# Patient Record
Sex: Female | Born: 1935 | Race: White | Hispanic: No
Health system: Southern US, Community
[De-identification: ages and names within clinical notes are randomized; demographics above are authoritative.]

---

## 2014-08-09 ENCOUNTER — Inpatient Hospital Stay: Payer: Self-pay | Admitting: Specialist

## 2014-08-09 LAB — CBC
HCT: 40.8 % (ref 35.0–47.0)
HGB: 13.5 g/dL (ref 12.0–16.0)
MCH: 29.2 pg (ref 26.0–34.0)
MCHC: 33.2 g/dL (ref 32.0–36.0)
MCV: 88 fL (ref 80–100)
Platelet: 211 10*3/uL (ref 150–440)
RBC: 4.63 10*6/uL (ref 3.80–5.20)
RDW: 13.8 % (ref 11.5–14.5)
WBC: 10.5 10*3/uL (ref 3.6–11.0)

## 2014-08-09 LAB — URINALYSIS, COMPLETE
Bilirubin,UR: NEGATIVE
Glucose,UR: NEGATIVE mg/dL (ref 0–75)
KETONE: NEGATIVE
Leukocyte Esterase: NEGATIVE
Nitrite: NEGATIVE
PH: 5 (ref 4.5–8.0)
Protein: 30
SPECIFIC GRAVITY: 1.021 (ref 1.003–1.030)

## 2014-08-09 LAB — COMPREHENSIVE METABOLIC PANEL
ALT: 171 U/L — AB
Albumin: 3.3 g/dL — ABNORMAL LOW (ref 3.4–5.0)
Alkaline Phosphatase: 112 U/L
Anion Gap: 12 (ref 7–16)
BILIRUBIN TOTAL: 0.5 mg/dL (ref 0.2–1.0)
BUN: 19 mg/dL — ABNORMAL HIGH (ref 7–18)
CALCIUM: 8.5 mg/dL (ref 8.5–10.1)
CO2: 24 mmol/L (ref 21–32)
Chloride: 106 mmol/L (ref 98–107)
Creatinine: 1.21 mg/dL (ref 0.60–1.30)
GFR CALC AF AMER: 49 — AB
GFR CALC NON AF AMER: 43 — AB
GLUCOSE: 138 mg/dL — AB (ref 65–99)
OSMOLALITY: 288 (ref 275–301)
POTASSIUM: 3.5 mmol/L (ref 3.5–5.1)
SGOT(AST): 127 U/L — ABNORMAL HIGH (ref 15–37)
SODIUM: 142 mmol/L (ref 136–145)
Total Protein: 7.4 g/dL (ref 6.4–8.2)

## 2014-08-09 LAB — APTT: Activated PTT: 29.8 secs (ref 23.6–35.9)

## 2014-08-09 LAB — PROTIME-INR
INR: 1.1
PROTHROMBIN TIME: 13.8 s (ref 11.5–14.7)

## 2014-08-09 LAB — CK TOTAL AND CKMB (NOT AT ARMC)
CK, TOTAL: 147 U/L
CK-MB: 1.5 ng/mL (ref 0.5–3.6)

## 2014-08-10 LAB — BASIC METABOLIC PANEL
Anion Gap: 8 (ref 7–16)
BUN: 18 mg/dL (ref 7–18)
CALCIUM: 7.6 mg/dL — AB (ref 8.5–10.1)
Chloride: 108 mmol/L — ABNORMAL HIGH (ref 98–107)
Co2: 26 mmol/L (ref 21–32)
Creatinine: 1.35 mg/dL — ABNORMAL HIGH (ref 0.60–1.30)
EGFR (Non-African Amer.): 37 — ABNORMAL LOW
GFR CALC AF AMER: 43 — AB
Glucose: 133 mg/dL — ABNORMAL HIGH (ref 65–99)
Osmolality: 287 (ref 275–301)
POTASSIUM: 3.3 mmol/L — AB (ref 3.5–5.1)
Sodium: 142 mmol/L (ref 136–145)

## 2014-08-10 LAB — HEPATIC FUNCTION PANEL A (ARMC)
ALK PHOS: 109 U/L
ALT: 270 U/L — AB
AST: 369 U/L — AB (ref 15–37)
Albumin: 2.5 g/dL — ABNORMAL LOW (ref 3.4–5.0)
BILIRUBIN TOTAL: 1.2 mg/dL — AB (ref 0.2–1.0)
Bilirubin, Direct: 0.8 mg/dL — ABNORMAL HIGH (ref 0.00–0.20)
Total Protein: 5.9 g/dL — ABNORMAL LOW (ref 6.4–8.2)

## 2014-08-10 LAB — PLATELET COUNT: Platelet: 148 10*3/uL — ABNORMAL LOW (ref 150–440)

## 2014-08-10 LAB — HEMOGLOBIN: HGB: 11.1 g/dL — ABNORMAL LOW (ref 12.0–16.0)

## 2014-08-11 LAB — COMPREHENSIVE METABOLIC PANEL
ANION GAP: 9 (ref 7–16)
Albumin: 2.5 g/dL — ABNORMAL LOW (ref 3.4–5.0)
Alkaline Phosphatase: 118 U/L — ABNORMAL HIGH
BILIRUBIN TOTAL: 0.6 mg/dL (ref 0.2–1.0)
BUN: 18 mg/dL (ref 7–18)
CREATININE: 1.26 mg/dL (ref 0.60–1.30)
Calcium, Total: 7.7 mg/dL — ABNORMAL LOW (ref 8.5–10.1)
Chloride: 110 mmol/L — ABNORMAL HIGH (ref 98–107)
Co2: 24 mmol/L (ref 21–32)
EGFR (African American): 47 — ABNORMAL LOW
EGFR (Non-African Amer.): 40 — ABNORMAL LOW
Glucose: 106 mg/dL — ABNORMAL HIGH (ref 65–99)
Osmolality: 287 (ref 275–301)
Potassium: 3.4 mmol/L — ABNORMAL LOW (ref 3.5–5.1)
SGOT(AST): 146 U/L — ABNORMAL HIGH (ref 15–37)
SGPT (ALT): 118 U/L — ABNORMAL HIGH
Sodium: 143 mmol/L (ref 136–145)
Total Protein: 5.9 g/dL — ABNORMAL LOW (ref 6.4–8.2)

## 2014-08-11 LAB — HEMOGLOBIN: HGB: 11.4 g/dL — ABNORMAL LOW (ref 12.0–16.0)

## 2014-08-11 LAB — PLATELET COUNT: PLATELETS: 153 10*3/uL (ref 150–440)

## 2014-08-11 LAB — HEPATIC FUNCTION PANEL A (ARMC): Bilirubin, Direct: 0.2 mg/dL (ref 0.00–0.20)

## 2014-08-11 LAB — GAMMA GT: GGT: 196 U/L — AB (ref 5–85)

## 2014-08-12 LAB — BASIC METABOLIC PANEL
Anion Gap: 6 — ABNORMAL LOW (ref 7–16)
BUN: 22 mg/dL — ABNORMAL HIGH (ref 7–18)
CHLORIDE: 112 mmol/L — AB (ref 98–107)
CO2: 23 mmol/L (ref 21–32)
Calcium, Total: 7.9 mg/dL — ABNORMAL LOW (ref 8.5–10.1)
Creatinine: 1.37 mg/dL — ABNORMAL HIGH (ref 0.60–1.30)
EGFR (African American): 42 — ABNORMAL LOW
GFR CALC NON AF AMER: 37 — AB
GLUCOSE: 110 mg/dL — AB (ref 65–99)
OSMOLALITY: 285 (ref 275–301)
Potassium: 3.9 mmol/L (ref 3.5–5.1)
Sodium: 141 mmol/L (ref 136–145)

## 2014-08-12 LAB — HEPATIC FUNCTION PANEL A (ARMC)
Albumin: 2.5 g/dL — ABNORMAL LOW (ref 3.4–5.0)
Alkaline Phosphatase: 107 U/L
Bilirubin, Direct: 0.1 mg/dL (ref 0.00–0.20)
Bilirubin,Total: 0.4 mg/dL (ref 0.2–1.0)
SGOT(AST): 58 U/L — ABNORMAL HIGH (ref 15–37)
SGPT (ALT): 53 U/L
TOTAL PROTEIN: 5.5 g/dL — AB (ref 6.4–8.2)

## 2014-08-13 LAB — HEPATIC FUNCTION PANEL A (ARMC)
ALT: 40 U/L
Albumin: 2.4 g/dL — ABNORMAL LOW (ref 3.4–5.0)
Alkaline Phosphatase: 100 U/L
Bilirubin, Direct: 0.1 mg/dL (ref 0.00–0.20)
Bilirubin,Total: 0.5 mg/dL (ref 0.2–1.0)
SGOT(AST): 40 U/L — ABNORMAL HIGH (ref 15–37)
Total Protein: 6.4 g/dL (ref 6.4–8.2)

## 2014-08-13 LAB — BASIC METABOLIC PANEL
ANION GAP: 9 (ref 7–16)
BUN: 22 mg/dL — AB (ref 7–18)
CHLORIDE: 111 mmol/L — AB (ref 98–107)
CO2: 23 mmol/L (ref 21–32)
CREATININE: 1.24 mg/dL (ref 0.60–1.30)
Calcium, Total: 7.9 mg/dL — ABNORMAL LOW (ref 8.5–10.1)
EGFR (Non-African Amer.): 41 — ABNORMAL LOW
GFR CALC AF AMER: 48 — AB
GLUCOSE: 143 mg/dL — AB (ref 65–99)
Osmolality: 291 (ref 275–301)
POTASSIUM: 3.6 mmol/L (ref 3.5–5.1)
Sodium: 143 mmol/L (ref 136–145)

## 2015-01-09 IMAGING — CT CT OF THE RIGHT HIP WITHOUT CONTRAST
1 series · 15 of 32 positions shown, 19 images · non-contrast
Comparison: Right hip radiographs performed earlier today at [DATE]
a.m.

CLINICAL DATA: Follow-up right femoral fracture; preoperative
planning.

EXAM:
CT OF THE RIGHT HIP WITHOUT CONTRAST
TECHNIQUE: Multidetector CT imaging was performed according to the standard
protocol. Multiplanar CT image reconstructions were also generated.

[Series 5: soft tissue · axial · 0.34mm/px · z∈[-1133,-983]mm · 15 of 112 slices shown, 19 images]
[im 8/112  soft-tissue]
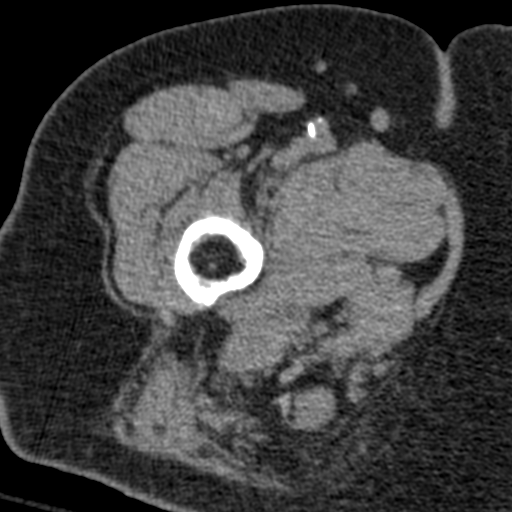
[im 8/112  bone]
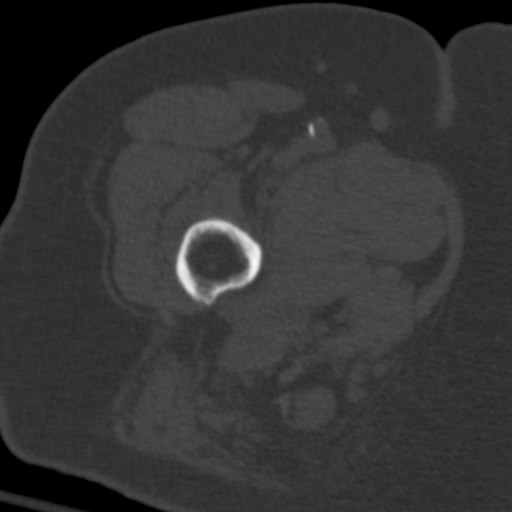
[im 15/112  soft-tissue]
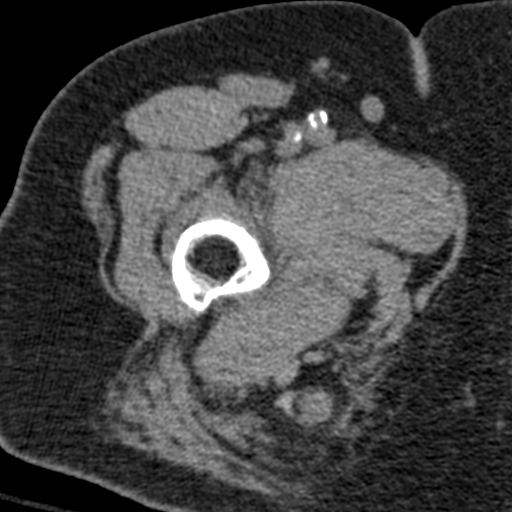
[im 22/112  soft-tissue]
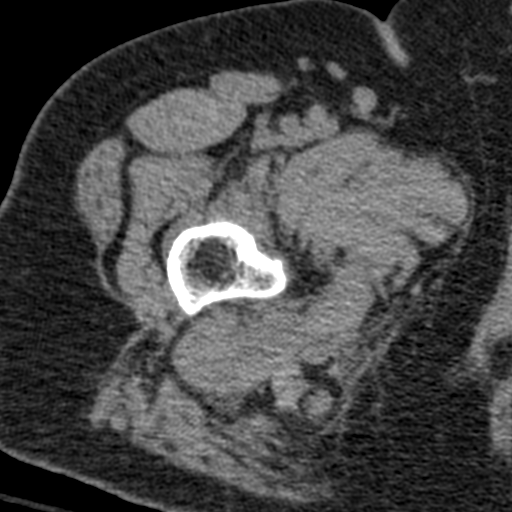
[im 33/112  soft-tissue]
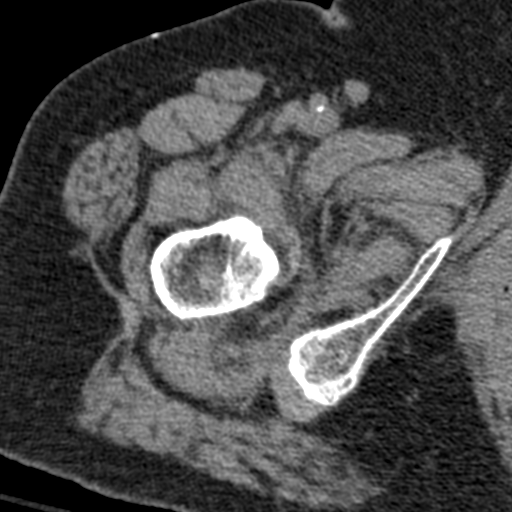
[im 40/112  soft-tissue]
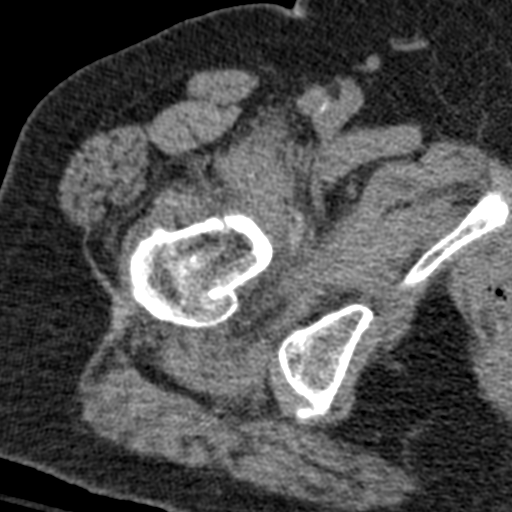
[im 47/112  soft-tissue]
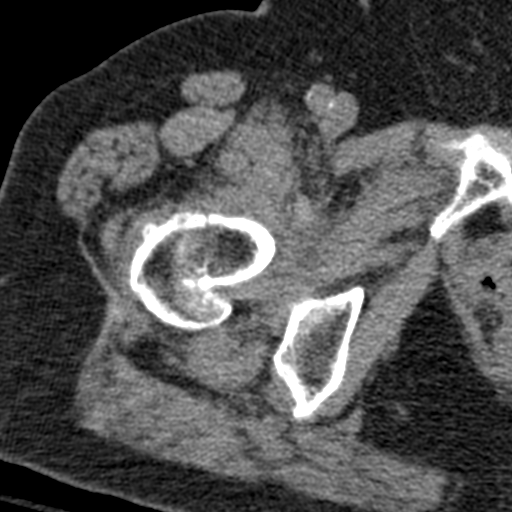
[im 58/112  soft-tissue]
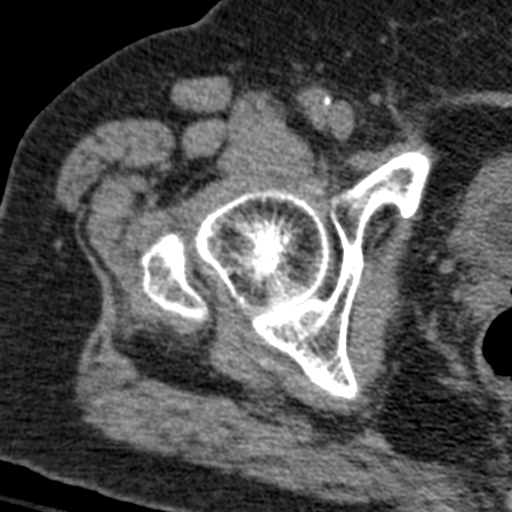
[im 65/112  soft-tissue]
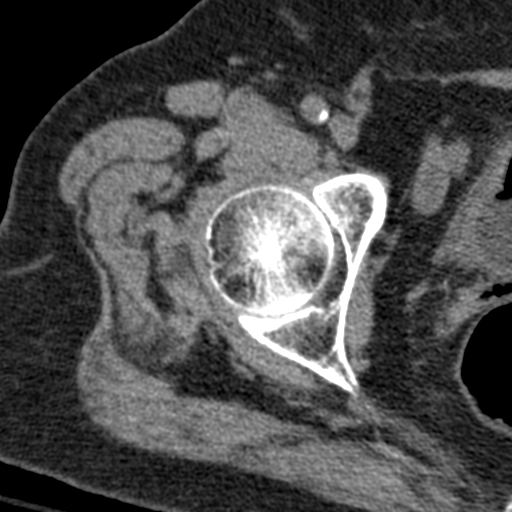
[im 72/112  soft-tissue]
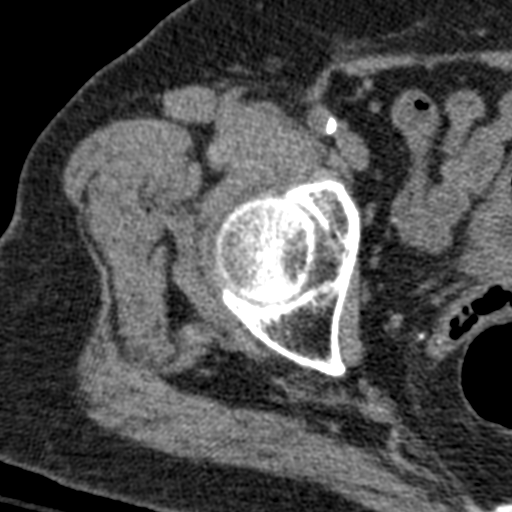
[im 72/112  bone]
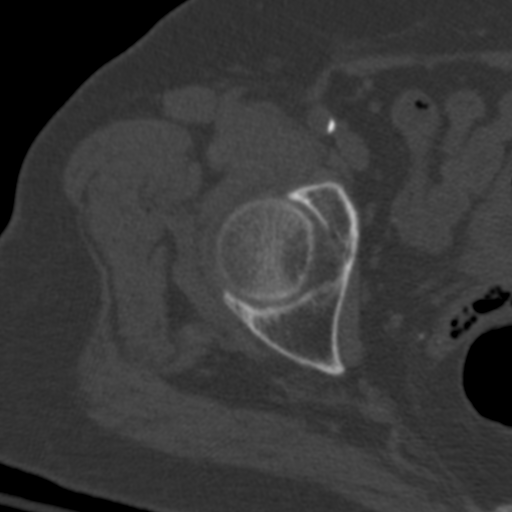
[im 79/112  soft-tissue]
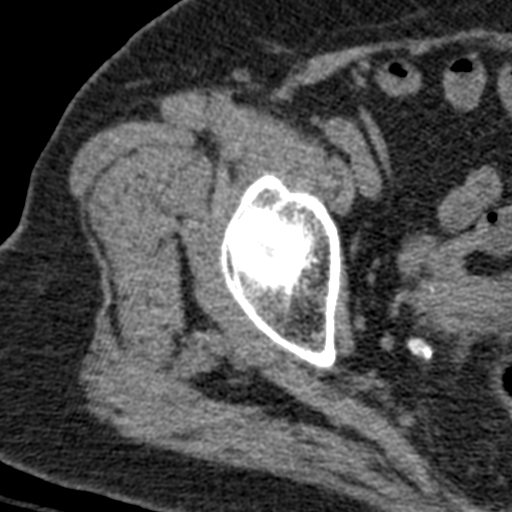
[im 90/112  soft-tissue]
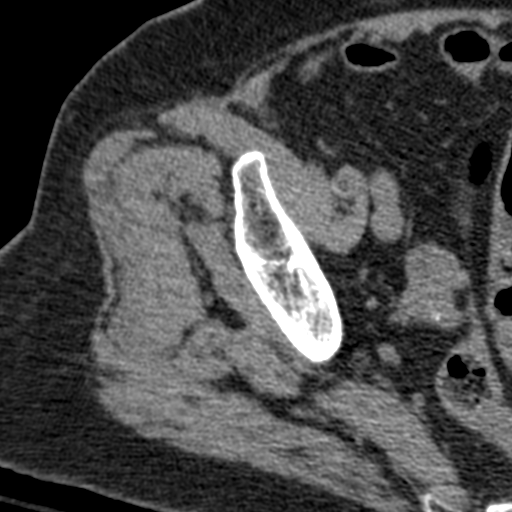
[im 97/112  soft-tissue]
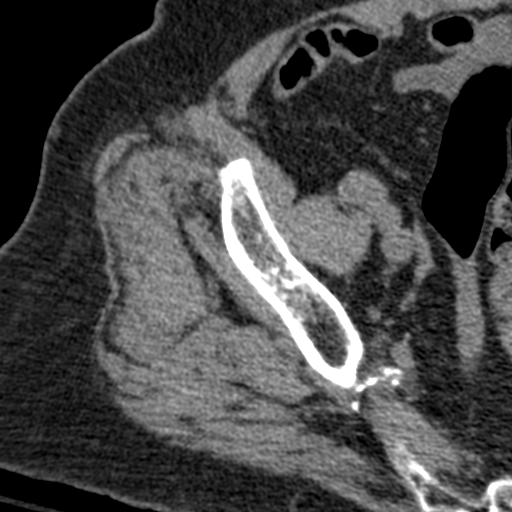
[im 97/112  lung]
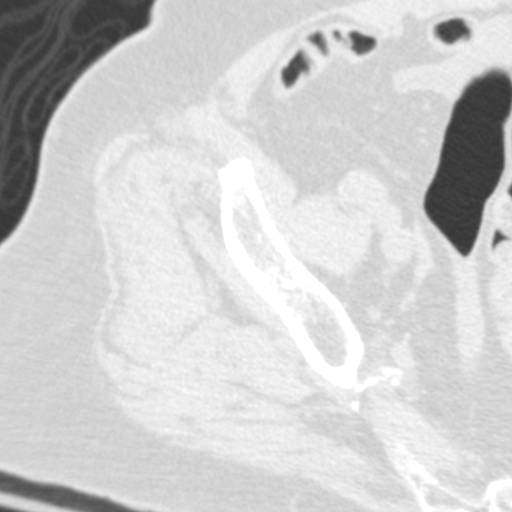
[im 101/112  lung]
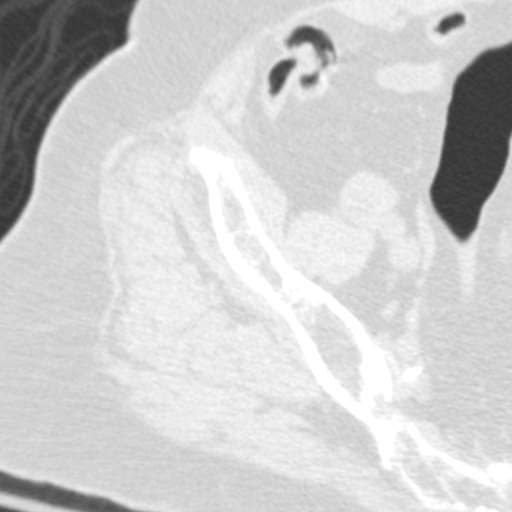
[im 104/112  soft-tissue]
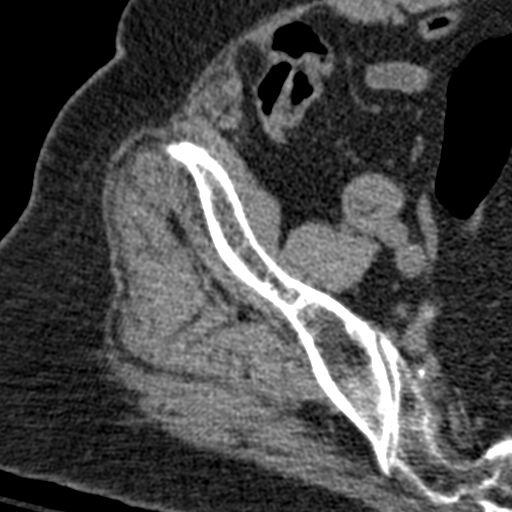
[im 104/112  lung]
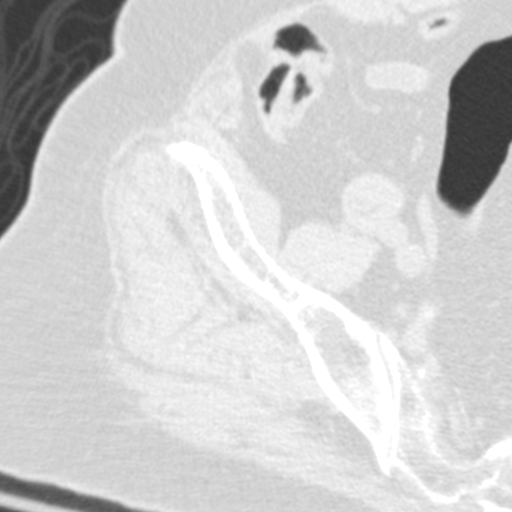
[im 108/112  lung]
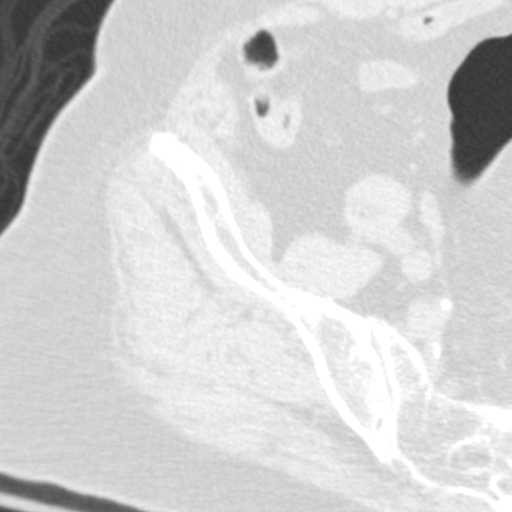

[15 of 32 positions shown; findings below may reference images not displayed]

FINDINGS: There is a minimally displaced basicervical fracture through the
right femoral neck, with slight involvement of the medial aspect of
the greater femoral trochanter. The right hip joint is grossly
unremarkable in appearance. The right femoral head remains seated at
the acetabulum.

No significant hip joint effusion is seen. Mild associated soft
tissue injury is suggested.

Scattered vascular calcifications are seen. Visualized small and
large bowel loops are grossly unremarkable. The bladder appears
somewhat thick-walled and irregular, with air noted in the bladder,
possibly reflecting Foley catheter placement.
IMPRESSION: 1. Minimally displaced basicervical fracture through the right
femoral neck, with slight involvement of the medial aspect of the
greater femoral trochanter. Mild associated soft tissue injury
noted.
2. Scattered vascular calcifications seen.
3. Thick-walled and irregular-appearing bladder, with air seen in
the bladder, possibly reflecting Foley catheter placement. Would
correlate for associated symptoms, and consider further evaluation
as deemed clinically appropriate.

## 2015-01-09 IMAGING — CR RIGHT HIP - 1 VIEW
4 series · 4 of 4 positions shown · non-contrast
Comparison: 08/09/2014.

CLINICAL DATA: Status post ORIF for hip fracture.

EXAM:
RIGHT HIP - 1 VIEW

[cont. (1 of 4)]
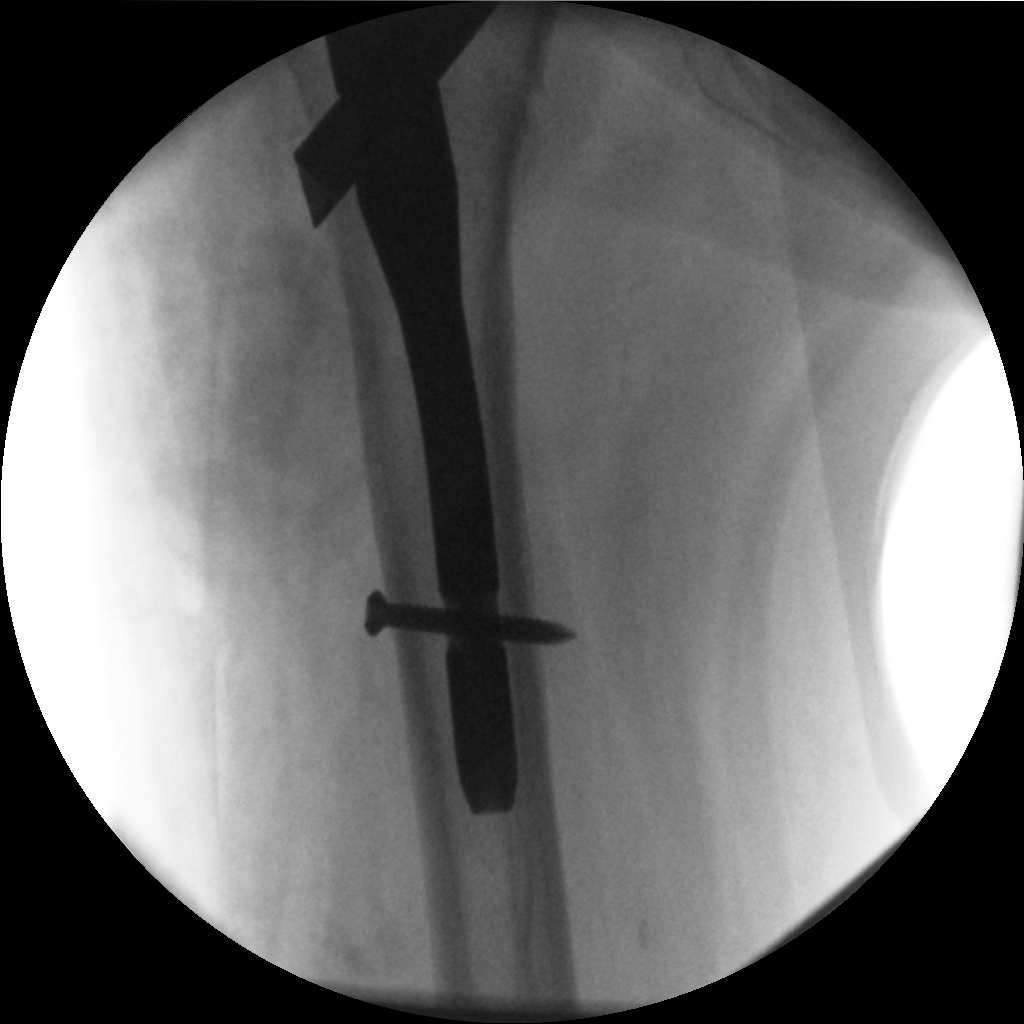

[cont. (2 of 4)]
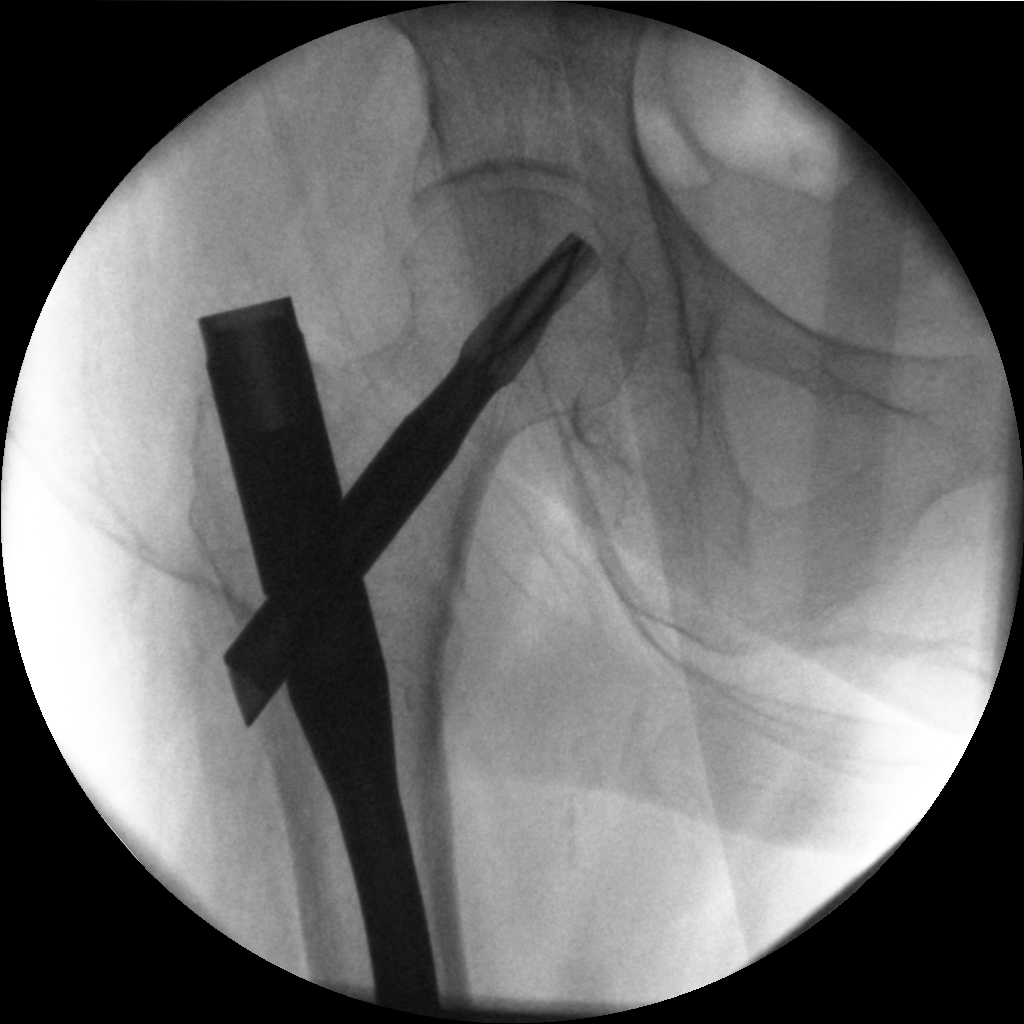

[cont. (3 of 4)]
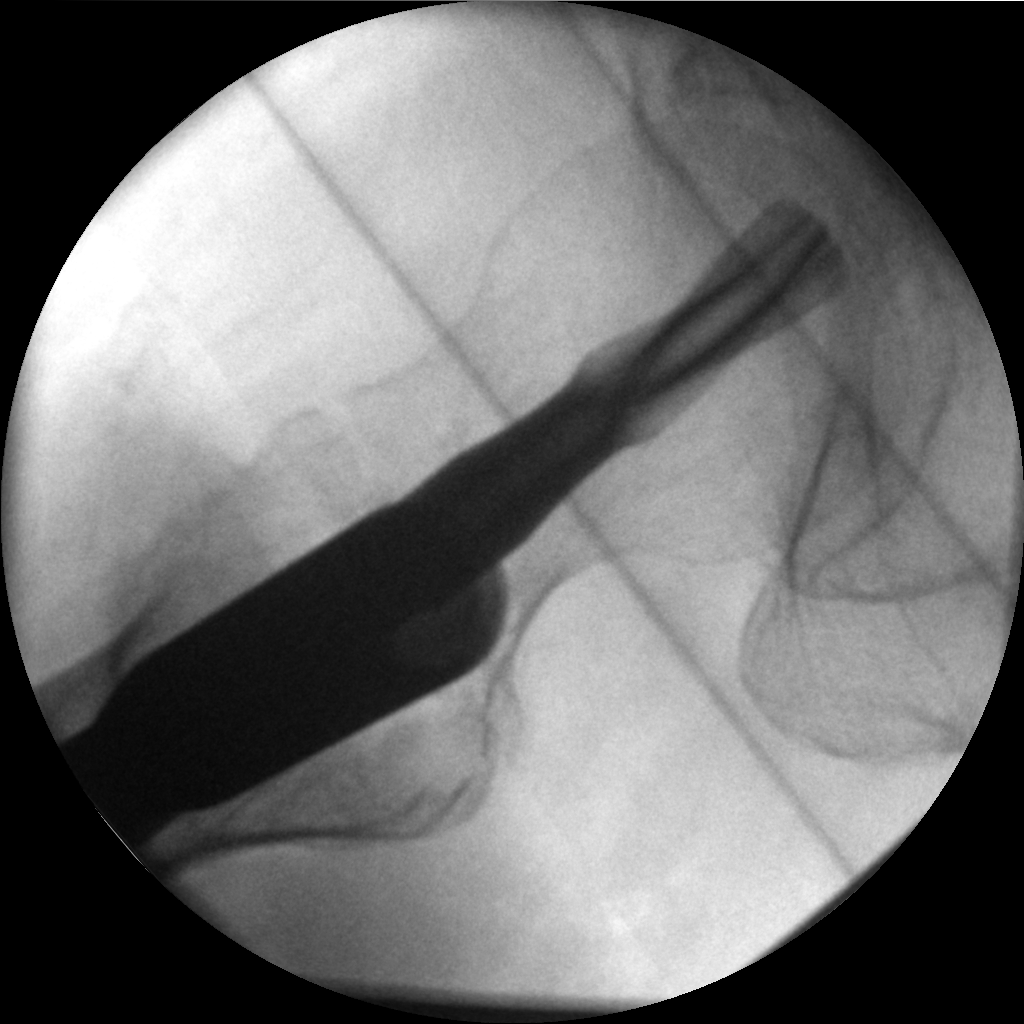

[cont. (4 of 4)]
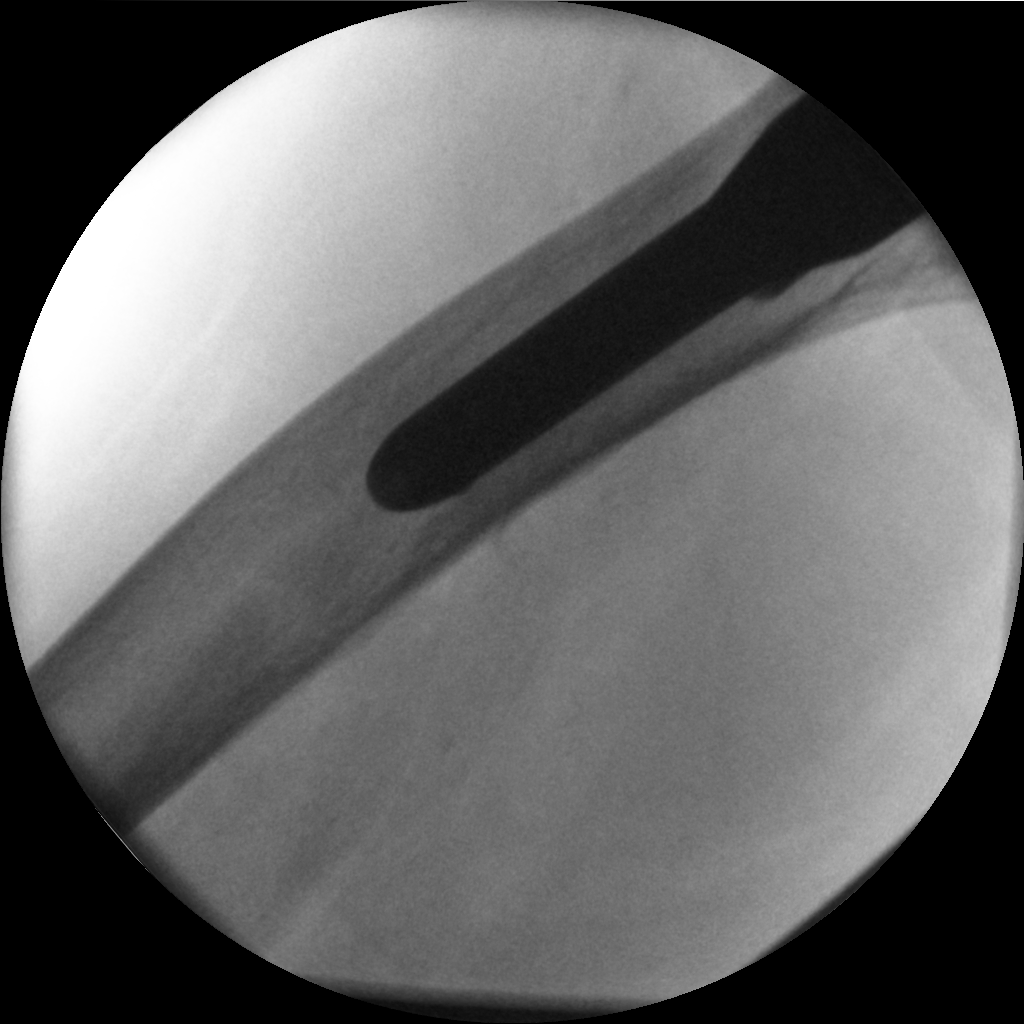

[4 of 4 positions shown; findings below may reference images not displayed]

FINDINGS: Multiple intraoperative fluoroscopic spot views of the right hip
demonstrate interval gamma nail fixation for previously noted
basicervical fracture. Anatomic alignment has been preserved. The
femoral head is located. No acute complicating features.
IMPRESSION: 1. Intraoperative documentation of ORIF for right basicervical
femoral neck fracture.

## 2015-01-10 IMAGING — US ABDOMEN ULTRASOUND LIMITED
1 series · 14 of 25 positions shown · non-contrast
Comparison: None.

CLINICAL DATA: Elevated liver enzymes

EXAM:
US ABDOMEN LIMITED - RIGHT UPPER QUADRANT

[Series 1: abdomen ultrasound limited · 0.25mm/px · 14 of 34 slices shown]
[im 1/34]
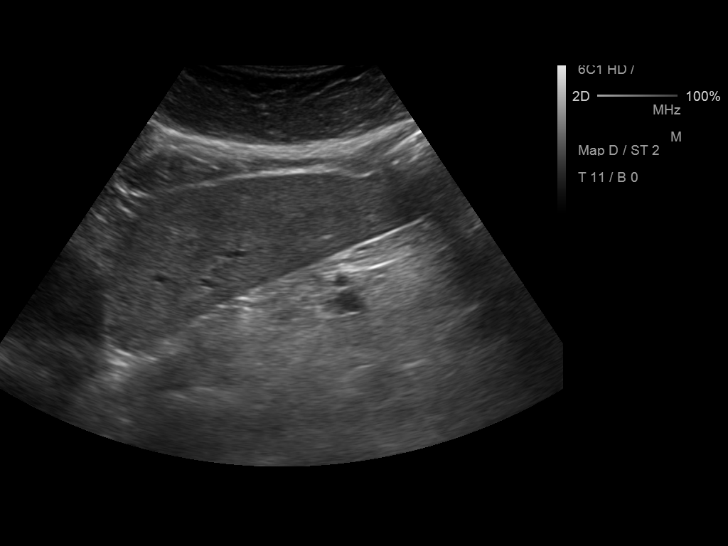
[im 3/34]
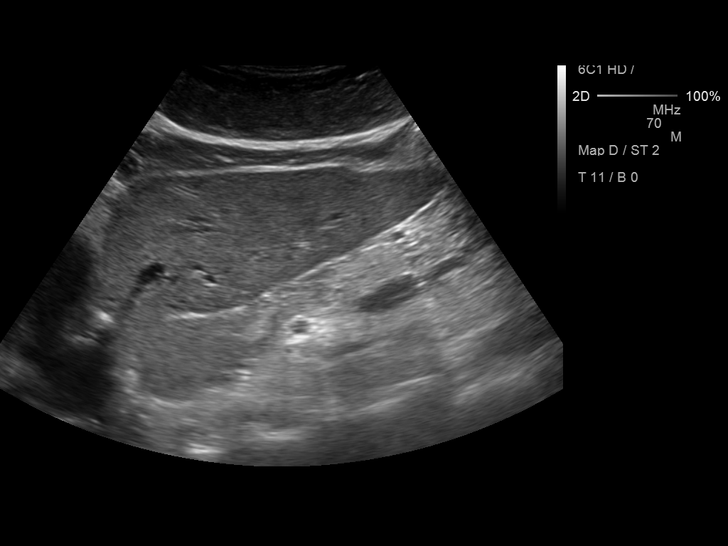
[im 6/34]
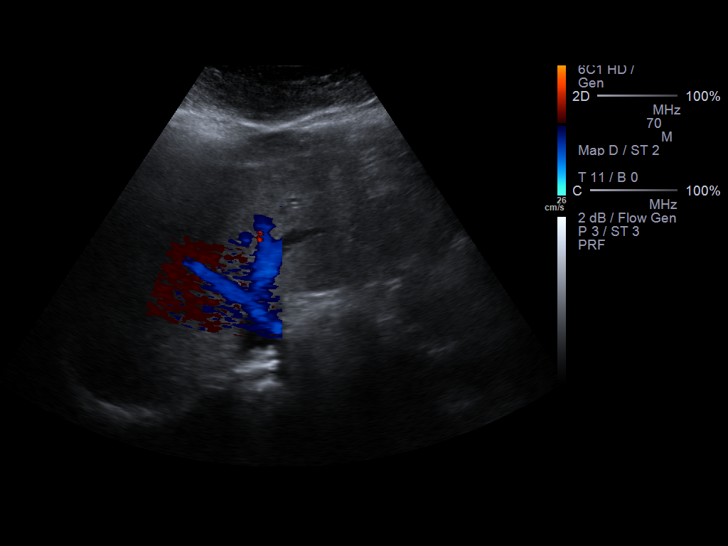
[im 9/34]
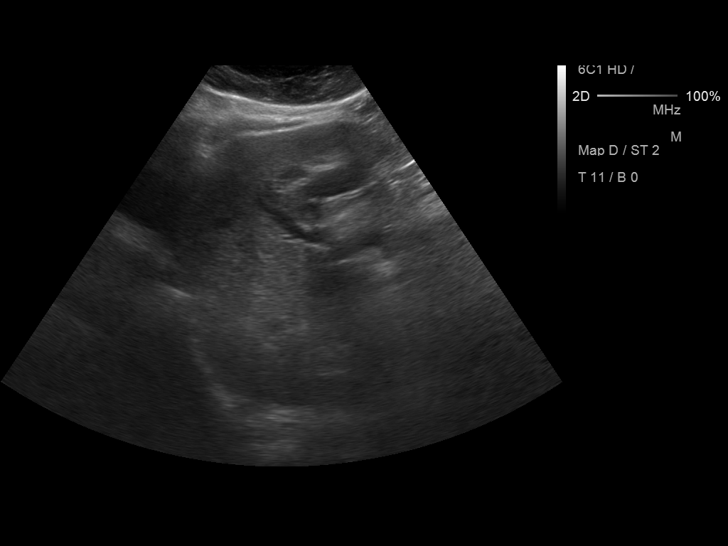
[im 12/34]
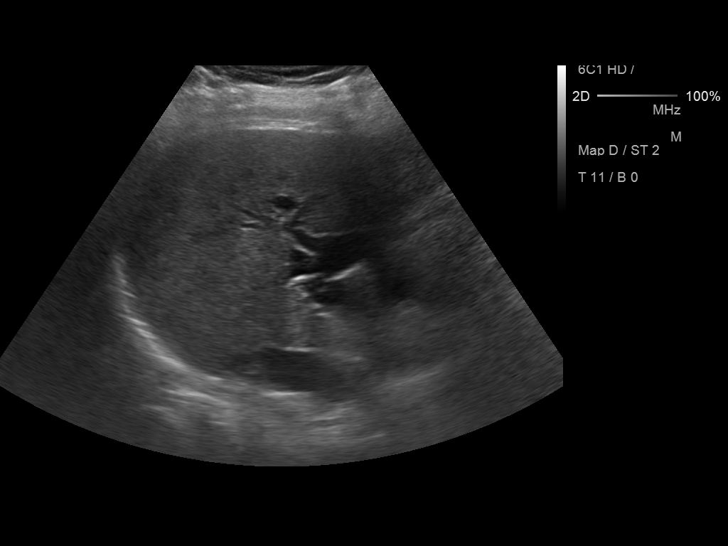
[im 13/34]
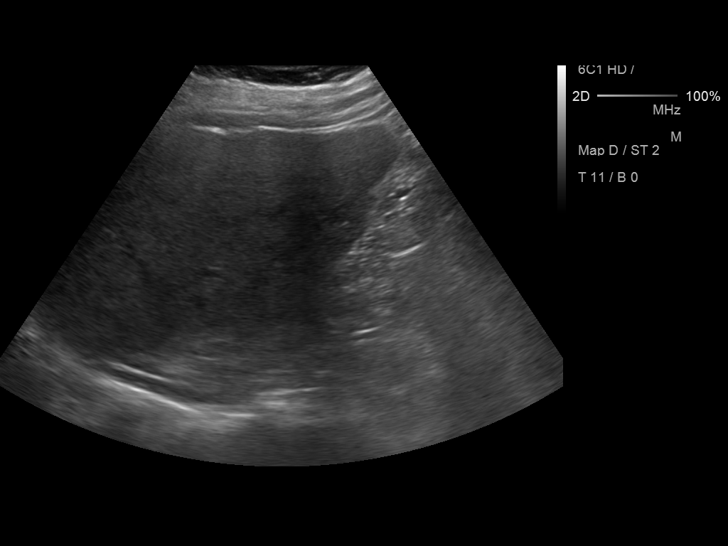
[im 16/34]
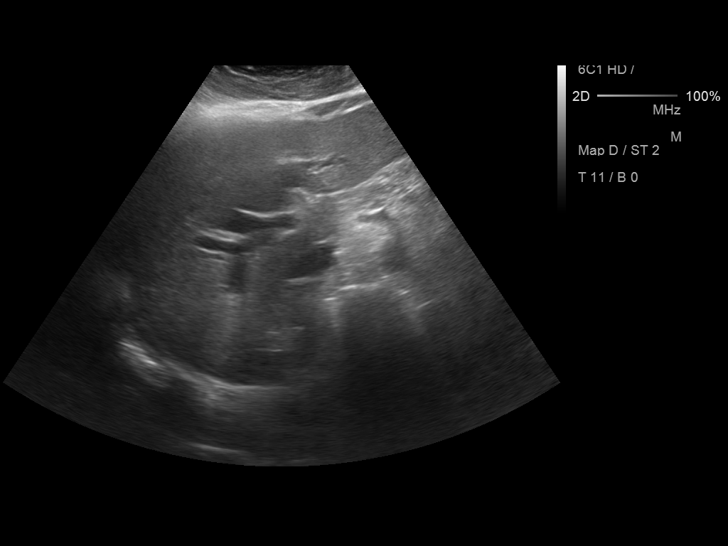
[im 18/34]
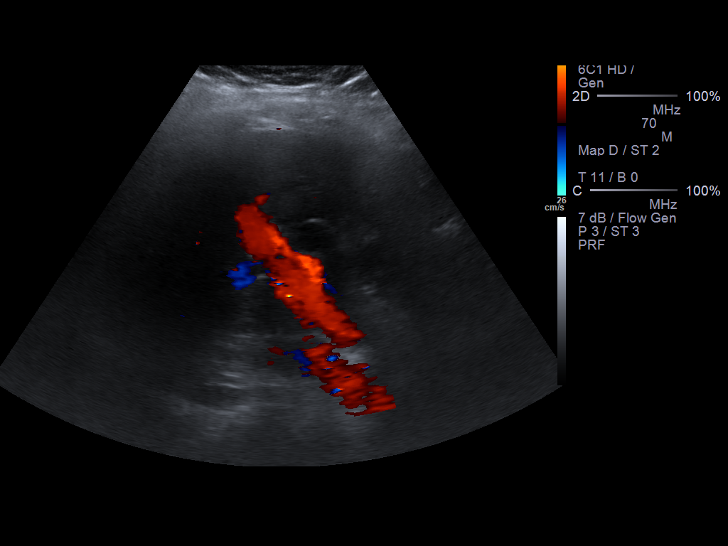
[im 21/34]
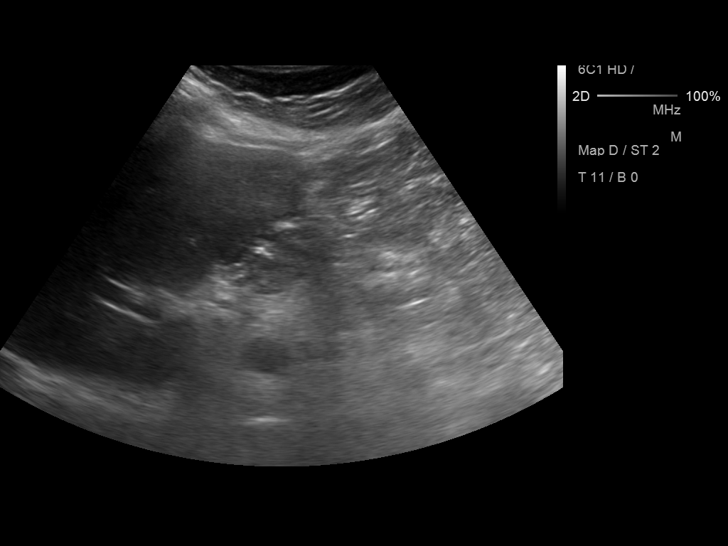
[im 23/34]
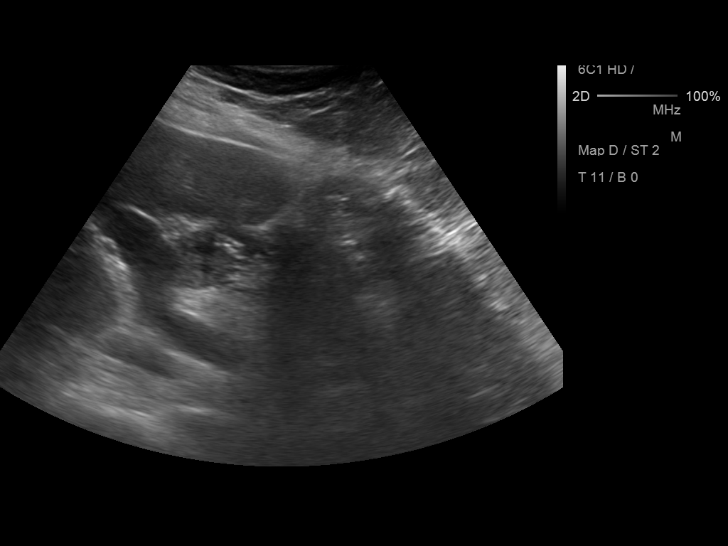
[im 25/34]
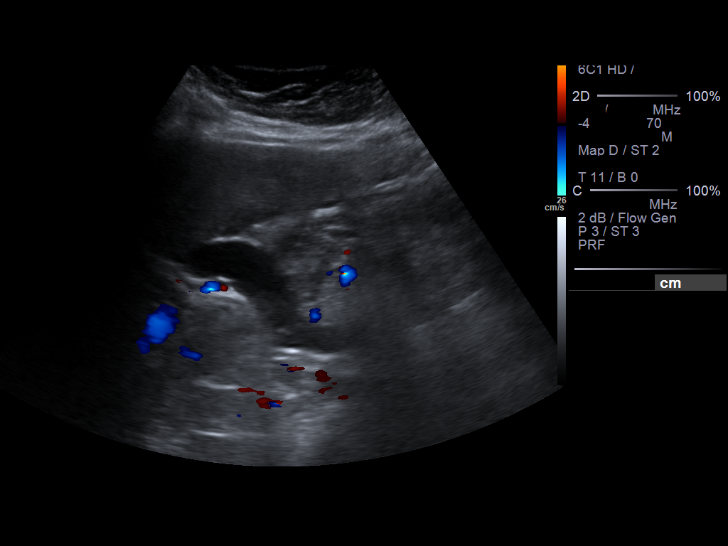
[im 28/34]
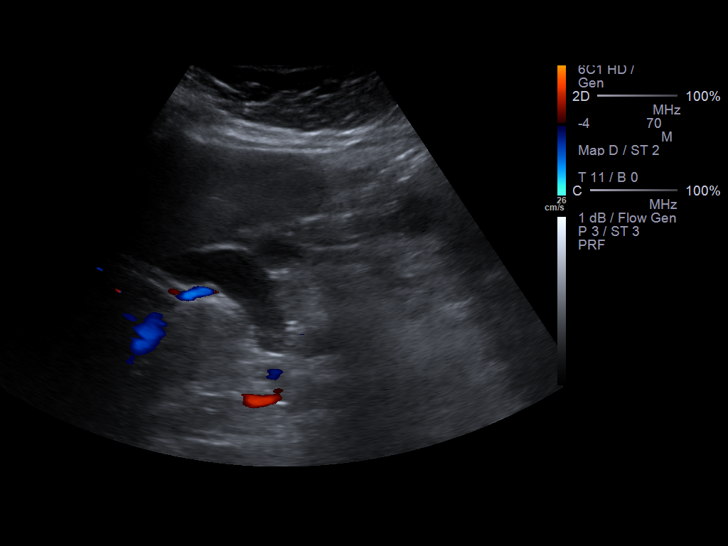
[im 31/34]
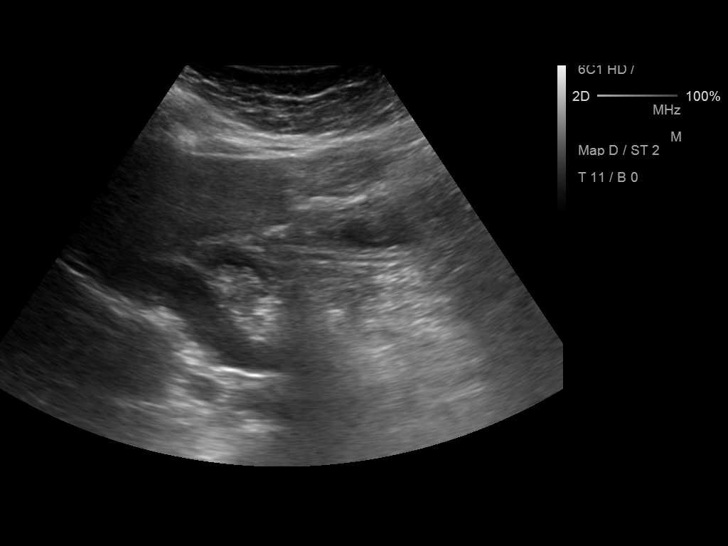
[im 34/34]
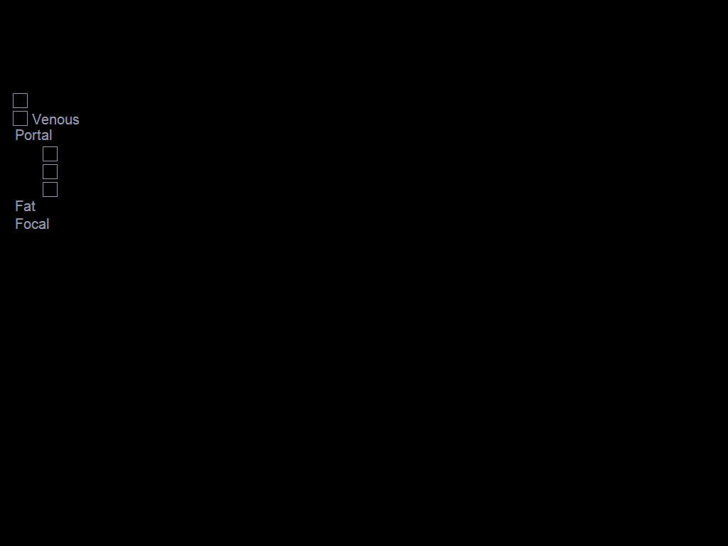

[14 of 25 positions shown; findings below may reference images not displayed]

FINDINGS: Gallbladder:

Surgically absent.

Common bile duct:

Diameter: The common hepatic duct measures 15 mm. The common bile
duct more distally tapers to 9 mm. There is also mild intrahepatic
biliary duct dilatation.

Liver:

No focal lesion identified.  Liver echogenicity is mildly increased.
IMPRESSION: There is biliary duct dilatation which may in part be due to post
cholecystectomy state. No focal lesion is identified within the
biliary ductal system. If there remains concern for biliary duct
pathology beyond dilatation, however, MRCP would probably be the
imaging study of choice to further evaluate.

Gallbladder is absent.

Increased echogenicity in the liver is consistent with hepatic
steatosis. While no focal liver lesions are identified, it should be
noted that the sensitivity of ultrasound for focal liver lesions is
diminished in this circumstance.

## 2015-04-20 NOTE — Consult Note (Signed)
LFT's down to normal.  Possible passed stone, possible epiphenomenon from surgery/anesthesia.  i will sign off, reconsult if needed.  Electronic Signatures: Scot JunElliott, Terrance Usery T (MD)  (Signed on 16-Aug-15 08:34)  Authored  Last Updated: 16-Aug-15 08:34 by Scot JunElliott, Yovany Clock T (MD)

## 2015-04-20 NOTE — Consult Note (Signed)
PATIENT NAME:  Reece LeaderANCE, Allyce G MR#:  [id number] DATE OF BIRTH:  1933-03-04  DATE OF CONSULTATION:  08/10/2014  REFERRING PHYSICIAN:    CONSULTING PHYSICIAN:  Cala BradfordKimberly A. Arvilla MarketMills, ANP (Adult Nurse Practitioner)  REFERRING PHYSICIAN:  Dr. Maryruth HancockSallie Patel, MD, MPH    CONSULTING PHYSICIAN: Scot Junobert T Elliott, MD/Tamaira Ciriello Arvilla MarketMills, ANP (adult nurse practitioner)   PRIMARY CARE PHYSICIAN:  Dr. Elna BreslowKathleen M Shapley-Quinn, MD      REASON FOR CONSULTATION: Elevated liver enzymes.   HISTORY OF PRESENT ILLNESS: This 81 year old patient has a history of dementia, and had a fall in her kitchen. She sustained a nondisplaced intratrochanteric fracture of the right hip, and was admitted to the hospital yesterday. She underwent surgery yesterday for this hip fracture. She has been found to have rising AST of 127 to 369, and ALT 171 to 270. In addition, she presented with total bilirubin 0.5, which is now 1.2, with direct bilirubin 0.80. The patient did have an abdominal ultrasound that showed fatty liver. There was biliary ductal dilatation maybe part due to past cholecystectomy. The patient has dementia, and is unable to give a good history, but she does have episodes of vomiting twice today when physical therapy sat her up. She has been tolerating clear liquids well all day long without nausea, or vomiting while drinking those liquids. She has denied abdominal pain. Chart reviewed, we do not have baseline liver labs to compare.   PAST MEDICAL HISTORY: 1.  Alzheimer dementia.  2.  CVA without residual deficits.  3.  Hypertension.  4.  Abdominal aortic aneurysm not repaired.   HOME MEDICATIONS:  Include:  1.  Lisinopril 20 mg daily.  2.  Benazepril 10 mg daily.  3.  Demadex 5 mg daily.  4.  Coreg 12.5 mg daily.  5.  Aspirin 81 mg daily.   ALLERGIES: NKDA  per chart review.   FAMILY HISTORY:  Unknown cardiovascular, pulmonary disorders.   SOCIAL HISTORY:  Negative alcohol or tobacco listed.   REVIEW OF  SYSTEMS: The patient is unable to give accurate review of systems. No family member is present at the bedside to assist. The patient states she is feeling pretty good.   PHYSICAL EXAMINATION: VITAL SIGNS: Temperature 97.8, pulse 63, respirations 17, blood pressure 116/67, pulse oximetry room air is 96%.   GENERAL:  Elderly Caucasian female who is sitting up in bed, somewhat drowsy.  HEENT:  Shows head is normocephalic, conjunctivae pink, sclerae anicteric, oral mucosa is dry and intact.  NECK:  Supple. Trachea is midline. No thyromegaly.  CARDIAC:  S1 and S2 without murmur.  LUNGS:  Are CTA, respirations have just a few scattered crackles, nonlabored.  ABDOMEN:  Soft, nontender in all quadrants.  MUSCULOSKELETAL:  Right hip dressing is dry, and intact, not evaluated. The patient has orthopedic devices on her legs and TED stocking on the right leg. She is elevated up on pillow. Says she is doing pretty well for pain.  NEUROLOGIC:  She is somewhat drowsy, she is alert, answers minimal questions.   LABORATORY:   1.  Admission blood work with BUN 19, creatinine 1.21, glucose 139, albumin 3.3, total bilirubin 0.5, alkaline phosphatase 112, AST 127, ALT 171; CPK total 147, WBC 10.5, hemoglobin is 12.5, platelet count 211, pro time 13.8, INR 1.1, PTT 29.8; urinalysis positive for blood, protein, and mucus.  2.  Studies today with albumin 2.5, total bilirubin 1.2, direct bilirubin 0.8, alkaline phosphatase 109, AST 3069, ALT 270, hemoglobin 11.1, platelet count 148.  RADIOLOGY:  1.  CT of the hip was positive for the fracture that has subsequently been repaired.  2.  Chest x-ray on admission with mild vascular congestion, and mild cardiomegaly, no displaced rib fracture. Lungs grossly clear aside from mild right mid lung zone scarring.  3.  Abdominal ultrasound limited view with common hepatic duct 15 mm, common bile duct more distally, tapers to 9 mm, there is also mild intrahepatic biliary ductal  dilatation, which may in part be due to post cholecystectomy state. No focal lesions identified. If there remains concern for bile duct pathology MRCP would be recommended as the next study. No discrete focal liver lesions.   IMPRESSION: The patient presents with acute mechanical fall and right hip fracture and was promptly treated with open reduction, and internal fixation of right intratrochanteric femur fracture yesterday. She presents with rising liver function tests, and slight elevation in bilirubin. Abdominal ultrasound shows hepatic steatosis, and mild ductal dilatation maybe consistent with cholecystectomy; however, if liver enzymes continue to rise, it may be prudent to perform a magnetic resonance cholangiopancreatography.   PLAN: 1.  Recommend liver panel every morning.  2.  Obtain GGT.  3.  Etiology of rising liver labs in the setting of acute fall, and surgery to include side effects from anesthesia, musculoskeletal problems from musculoskeletal elevation from the fall, and we also need to consider emerging biliary abnormality; further GI recommendations pending patient's clinical course.  4.  The nursing staff did report vomiting twice only when she was placed in a sitting up position, by physical therapy. She has been tolerating clear liquid diet well throughout the day.   This case was discussed with Dr. Mechele Collin, these services provided by Ranae Plumber. Arvilla Market, MS, APRN, BC, ANP (Adult Nurse Practitioner) under collaborative agreement with Dr. Scot Jun, MD    ____________________________ Ranae Plumber Arvilla Market, ANP (Adult Nurse Practitioner) kam:nt D: 08/10/2014 19:40:00 ET T: 08/10/2014 21:25:29 ET JOB#: [id number]  cc: Cala Bradford A. Arvilla Market, ANP (Adult Nurse Practitioner), <Dictator> Ranae Plumber Suzette Battiest, MSN, ANP-BC Adult Nurse Practitioner ELECTRONICALLY SIGNED 08/13/2014 8:58

## 2015-04-20 NOTE — Consult Note (Signed)
Brief Consult Note: Diagnosis: Elevated liver enzymes following hip surgery.   Patient was seen by consultant.   Consult note dictated.   Comments: Serial monitoring of LFT. Check GGT. See full note.  Electronic Signatures: Rowan BlaseMills, Roselie Cirigliano Ann (NP)  (Signed 14-Aug-15 18:33)  Authored: Brief Consult Note   Last Updated: 14-Aug-15 18:33 by Rowan BlaseMills, Kiyani Jernigan Ann (NP)

## 2015-04-20 NOTE — Discharge Summary (Signed)
PATIENT NAME:  Rachael Carter, THOENNES MR#:  [id number] DATE OF BIRTH:  [birth year]  DATE OF ADMISSION:  08/09/2014 DATE OF DISCHARGE:  08/13/2014  For a detailed note, please take a look a the history and physical done on admission by Dr. Angelica Ran.   DISCHARGE DIAGNOSES:  1.  Status post fall and right hip fracture, status post open reduction internal fixation.  2.  Hypertension.  3.  Dementia.  4.  Abnormal liver function tests.  DISCHARGE DIET: The patient is being discharged on a low-sodium diet.   DISCHARGE ACTIVITY: As tolerated.   DISCHARGE FOLLOWUP: Follow up with Dr. Francesco Sor in the next 1 to 2 weeks. Also follow up with Dr. Jeanie Cooks in the next 1 to 2 weeks.   DISCHARGE MEDICATIONS: 1.  Lisinopril 20 mg daily. 2.  Aricept 10 mg at bedtime. 3.  Namenda 5 mg daily. 4.  Coreg 12.5 mg daily. 5.  Aspirin 81 mg daily. 6.  Tylenol 500 mg q. 4 hours as needed. 7.  Oxycodone 5 mg q. 4 hours as needed for moderate pain. 8.  Tramadol 50 mg q. 4 hours as needed. 9.  Magnesium hydroxide suspension 30 mL b.i.d. as needed for constipation. 10.  Colace with senna 50/8.6 one tab b.i.d.   CONSULTANTS DURING THE HOSPITAL COURSE: Dr. Francesco Sor from orthopedics.   PERTINENT STUDIES DONE DURING THE HOSPITAL COURSE: A CT scan of the right hip done without contrast on admission showing minimally displaced basicervical fracture through the right femoral neck with slight involvement of the medial aspect of the greater femoral trochanter.   A chest x-ray done on admission showing mild vascular congestion with mild cardiomegaly.   X-ray of the right hip done on admission showing findings are highly concerning for subtle nondisplaced intertrochanteric hip fracture on the right.   HOSPITAL COURSE: This is a 81 year old female with medical problems as mentioned above who presented to the hospital due to a fall and noted to have a right hip fracture.  1.  Status post fall and right hip  fracture. This was a mechanical fall. The patient was seen by orthopedics. She underwent open reduction internal fixation of the right hip. Tolerated it well. There were no postoperative complications. The patient has tolerated physical therapy well. Her pain is well-controlled with oral meds with tramadol and oxycodone as stated. She is working with physical therapy well as mentioned, therefore being discharged to short-term rehab for ongoing physical therapy.  2.  Hypertension. The patient remained hemodynamically stable. She will continue her Coreg and lisinopril.  3.  Dementia. The patient had no evidence of sundowning or agitation. She will continue her Namenda and Aricept.  4.  Abnormal LFTs. The patient's LFTs were significantly elevated postoperatively. An abdominal ultrasound was obtained, which showed just fatty liver disease but no evidence of any acute intra-abdominal pathology. A GI consult was obtained. As per them, the most likely cause of the patient's abnormal LFTs was secondary to fatty liver disease, also complicated due to anesthesia that she received during surgery. The patient's LFTs were further followed and have trended down and have pretty much normalized now. She likely needs to have her liver function tests repeated in the next couple of weeks from now. The patient is clinically stable from that standpoint. Therefore, she is being discharged to a skilled nursing facility.  5.  Pulmonary edema. Postoperative day 3 the patient developed some mild hypoxemia with O2 sats in the low 90s on minimal oxygen. A  chest x-ray was obtained, which showed some pulmonary edema and CHF. She received 1 dose of IV Lasix, which she responded well to, and her lungs are presently clear and her O2 sat is 93% on room air.   The patient is a FULL code. She is being discharged to a skilled nursing facility for ongoing rehab.   TIME SPENT ON DISCHARGE: 40 minutes.  ____________________________ Rolly PancakeVivek J.  Cherlynn KaiserSainani, MD vjs:sb D: 08/13/2014 10:07:43 ET T: 08/13/2014 10:36:23 ET JOB#: [id number]  cc: Rolly PancakeVivek J. Cherlynn KaiserSainani, MD, <Dictator> Illene LabradorJames P. Angie FavaHooten Jr., MD Dr. Jeanie CooksShapley-Quinn Houston SirenVIVEK J SAINANI MD ELECTRONICALLY SIGNED 08/14/2014 16:03

## 2015-04-20 NOTE — Consult Note (Signed)
Pt with fatty liver and a small jump in LFT's after surgery.  Will follow with you for this problem.  Could be simple and benign reaction to anesthesia/surgery.  REpeat labs over next few days.  Electronic Signatures: Scot JunElliott, Zakyla Tonche T (MD)  (Signed on 14-Aug-15 20:25)  Authored  Last Updated: 14-Aug-15 20:25 by Scot JunElliott, Juliane Guest T (MD)

## 2015-04-20 NOTE — Op Note (Signed)
PATIENT NAME:  Rachael Carter, Rachael Carter MR#:  [id number] DATE OF BIRTH:  24-May-1933  DATE OF PROCEDURE:  08/09/2014  PREOPERATIVE DIAGNOSIS: Right intertrochanteric femur fracture.   POSTOPERATIVE DIAGNOSIS: Right intertrochanteric femur fracture.   PROCEDURE PERFORMED: Open reduction and internal fixation of right intertrochanteric femur fracture.   SURGEON: Dr. Francesco Sor.   ANESTHESIA: Spinal.   ESTIMATED BLOOD LOSS: 100 mL.   FLUIDS REPLACED: 1100 mL of crystalloid.   DRAINS: None.   IMPLANTS UTILIZED: Synthes 11-mm, 135-degree trochanteric fixation nail, 95-mm helical blade, and a 5-mm x 36-mm locking screw.   INDICATIONS FOR SURGERY: The patient is a 81 year old female who tripped on a rug at home and fell, landing on her right hip and leg. She was unable to stand or bear weight on the right lower extremity due to the hip pain. X-rays demonstrated a right intertrochanteric femur fracture, which was confirmed by a CT scan. After discussion of the risks and benefits of surgical intervention with the patient and her daughter, they expressed understanding of the risks, benefits, and agreed with plans for surgical intervention.   PROCEDURE IN DETAIL: The patient was brought to the operating room, and after adequate spinal anesthesia was achieved, the patient was placed on the fracture table. All bony prominences were well padded. Provisional reduction was performed with good position noted in both AP and lateral planes using the C-arm. The patient's right hip and leg were cleaned and prepped with alcohol and DuraPrep, draped in the usual sterile fashion. A "timeout" was performed as per usual protocol. A lateral longitudinal incision was made extending from the area of the tip of the greater trochanter proximally. The fascia was incised, and the fibers of the gluteus musculature were split in line. The tip of the greater trochanter was identified and a distally threaded guide pin was inserted through  the tip of the greater trochanter into the femoral canal. Position was confirmed using the C-arm in multiple planes. A step drill was used to enlarge the entry hole into the femoral canal. Preoperative planning determined that a 135-degree trochanteric fixation nail was appropriate. The 11-mm by 135-degree trochanteric fixation nail was advanced over the guidewire with good position noted. Guidewire was removed. A second stab incision was made laterally, and a tissue protector was inserted through the outrigger device and advanced to the lateral cortex of the femur. A distally threaded guidepin was inserted through the lateral cortex and into the femoral neck and head. Good position was noted in both AP and lateral planes using a C-arm. Measurements were obtained. It was felt that a 95-mm helical blade was appropriate. Lateral cortex was reamed, and a cannulated reamer was advanced over the guidewire. Next, the 95-mm helical blade was advanced over the guidewire and impacted into place. Good purchase was appreciated and good position was noted in both AP and lateral planes using the C-arm. The locking device was engaged proximally. Finally, a third stab incision was made, and a tissue protector was advanced through the outrigger device to the lateral cortex. The distal locking screw was inserted in the usual fashion using a 5-mm x 36-mm locking screw. Good position was noted in both AP and lateral planes. The wounds were irrigated with copious amounts of normal saline with antibiotic solution using a bulb syringe. Good hemostasis was noted. The fascia was closed using interrupted sutures of #1 Vicryl. The subcutaneous tissue was approximated in layers using first #0 Vicryl followed by 2-0 Vicryl. Skin was closed with skin staples.  A sterile dressing was applied.   The patient tolerated the procedure well. She was transported to the recovery room in stable condition.    ____________________________ Illene LabradorJames P.  Angie FavaHooten Jr., MD jph:lt D: 08/09/2014 21:56:53 ET T: 08/09/2014 22:31:53 ET JOB#: [id number]  cc: Illene LabradorJames P. Angie FavaHooten Jr., MD, <Dictator> JAMES P Angie FavaHOOTEN JR MD ELECTRONICALLY SIGNED 08/13/2014 7:25

## 2015-04-20 NOTE — Consult Note (Signed)
VSS afeb, denies abd pain or nausea.  LFT's falling today with SGOT and SGPT down significantly, GGT elevated so the source was liver.  Abd non tender.  No further action needed but to repeat LFT's in 2 days, she may be near baseline given her fatty liver.  Electronic Signatures: Scot JunElliott, Robert T (MD)  (Signed on 15-Aug-15 09:49)  Authored  Last Updated: 15-Aug-15 09:49 by Scot JunElliott, Robert T (MD)

## 2015-04-20 NOTE — H&P (Signed)
PATIENT NAME:  Rachael Carter, Rachael Carter MR#:  [id number] DATE OF BIRTH:  1933/09/12  DATE OF ADMISSION:  08/09/2014  REFERRING PHYSICIAN: Dr. Scotty CourtStafford.   PRIMARY CARE PHYSICIAN:  Dr. Perley JainShapely-Quinn.  CHIEF COMPLAINT: Fall.   HISTORY OF PRESENT ILLNESS: A 81 year old Caucasian female with past medical history of hypertension and dementia who is presenting after a fall. She describes a mechanical fall at home in her kitchen; however, she does not actually recall any events of the accident given her baseline dementia. She had pain located over her right hip, acutely described only as "pain".  intensity 8 out of 10, nonradiating, worse with movements. No relieving factors. In the Emergency Department noted to have a nondisplaced intertrochanteric fracture of the right hip.   REVIEW OF SYSTEMS: Question the reliability of these statements, given her confusion at baseline, however:  CONSTITUTIONAL: Denies fevers, chills, weakness.  EYES: Denied blurred vision, double vision or eye pain.  EARS, NOSE, THROAT: Denies tinnitus, ear pain, hearing loss.  RESPIRATORY: Denies cough, wheeze, shortness of breath.  CARDIOVASCULAR: Denies chest pain, palpitations, edema.  GASTROINTESTINAL: Denies nausea, vomiting, diarrhea, abdominal.  GENITOURINARY: Denies dysuria or hematuria.  ENDOCRINE: Denies nocturia or thyroid problems.  HEMATOLOGIC AND LYMPHATIC: Denies easy bruising or bleeding.  SKIN: Denies rashes or lesions.  MUSCULOSKELETAL: Positive for pain in the right hip as described above. Otherwise, denies any neck pain, back pain, or further arthritic symptoms.  NEUROLOGIC: Denies paralysis, paresthesias.  PSYCHIATRIC: Denies anxiety or depressive symptoms.  Otherwise, full review of systems performed by me is negative.   PAST MEDICAL HISTORY: CVA without residual deficits,  Alzheimer's dementia and hypertension.   SOCIAL HISTORY: No alcohol, tobacco, or drug usage. Is fully ambulatory at baseline. Does not  require a cane or walker for assistance. However, she is confused at baseline having difficulty carrying on conversations, per daughter at bedside.   FAMILY HISTORY: No known cardiovascular or pulmonary disorders.   ALLERGIES: No known drug allergies.   HOME MEDICATIONS: Include lisinopril 20 mg p.o. q. daily, donepezil 10 mg p.o. q. daily, Namenda 5 mg p.o. q. daily, Coreg 12.5 mg p.o. q. daily, aspirin 81 mg p.o. q. daily.   PHYSICAL EXAMINATION:  VITAL SIGNS: Temperature 98.1, heart rate 89, respirations 24, blood pressure 184/87, saturating 98% on room air.  GENERAL: Well-nourished, well-developed, Caucasian female, currently in minimal distress given pain.  HEAD: Normocephalic, atraumatic.  EYES: Pupils equal, round, reactive to light. Extraocular muscles intact. No scleral icterus.  MOUTH: Moist mucosal membrane. Dentition intact. No abscess noted.  EARS, NOSE AND THROAT: Clear without exudates. No external lesions.  NECK: Supple. No thyromegaly. No nodules. No JVD.  PULMONARY: Clear to auscultation bilaterally without wheezes, rubs or rhonchi. No use of accessory muscles. Good respiratory effort.  CHEST: Nontender to palpation.  CARDIOVASCULAR: S1, S2, regular rate and rhythm. No murmurs, rubs, or gallops. No edema. Pedal pulses 2+ bilaterally.  GASTROINTESTINAL: Soft, nontender, nondistended. No masses. Positive bowel sounds. No hepatosplenomegaly.  MUSCULOSKELETAL: Right lower extremity externally rotated with tenderness to palpation as expected over the lateral portion of the right hip. Otherwise, there was no cyanosis, clubbing, or edema. Range of motion limited in the right lower extremity given the fracture. Otherwise, range of motion full in all extremities.  NEUROLOGIC: Cranial nerves II through XII intact. No gross focal neurological deficits. Sensation intact. Reflexes intact.  SKIN: No ulceration, lesions, rashes, or cyanosis. Skin warm, dry. Turgor intact.  PSYCHIATRIC: Mood  and affect blunted. She is awake, alert, oriented  to self, however, does have some difficulty caring on general conversation, which was good for her by daughter at bedside is baseline. Insight and judgment appear to be poor.   LABORATORY DATA: EKG performed. Normal sinus rhythm. No ST or T wave abnormalities. Chest x-ray performed: Mild vascular congestion with cardiomegaly; however, no acute findings. X-ray of the right hip reveals nondisplaced intertrochanteric hip fracture. Remainder of laboratory data: Sodium 142, potassium 3.5, chloride 106, bicarbonate 24, BUN 19, creatinine 1.21, glucose 138. LFTs: Albumin of 3.3, AST 127, ALT 171. WBC 10.5, hemoglobin 13.5, platelets 211,000. Urinalysis negative for evidence of infection.   ASSESSMENT AND PLAN: A 81 year old Caucasian female with history of hypertension and dementia presenting after a fall, found to have an intertrochanteric fracture.   1. Preoperative evaluation for nondisplaced intertrochanteric right hip fracture. She should be  considered a low to moderate risk for moderate risk surgery given her age as well as unknown metastases. Her metastases would be considered less than 4 given general ambulatory dysfunction; however, no symptoms of chest pain or further anginal symptoms. She has no arrhythmia, symptoms of congestive heart failure or valvular heart disease, which would preclude her from surgery and, therefore, no further interventions or testing are required prior to surgery. As far as her medications are concerned, will continue with beta blockade as well as Namenda and donepezil.  Hold ACE inhibitor for the potential for preoperative or perioperative hypotension and restart post surgery. Also hold her aspirin. Initiate pain control as well as a bowel regimen. Consult orthopedics, defer venous thromboembolus prophylactic to orthopedics.  2. Hypertension. Continue with Coreg. Hold ACE inhibitors as described above. Once again, can restart  after surgery. Add hydralazine p.r.n. as needed for blood pressure greater than 180/100.  3. Dementia. Continue with Namenda and donepezil.  4. Venous thromboembolism prophylaxis with sequential compression devices.   CODE STATUS: The patient is full code.   TIME SPENT: 45 minutes.   ____________________________ Cletis Athens. Hower, MD dkh:JT D: 08/09/2014 03:52:51 ET T: 08/09/2014 04:14:15 ET JOB#: [id number]  cc: Cletis Athens. Hower, MD, <Dictator> DAVID Synetta Shadow MD ELECTRONICALLY SIGNED 08/09/2014 20:36

## 2015-04-20 NOTE — Consult Note (Signed)
Brief Consult Note: Diagnosis: right intertrochanteric femur fracture.   Patient was seen by consultant.   Comments: Discussed with daughter & patient. Recommend ORIF. The risks and benefits of surgical intervention were discussed in detail with the patient. The patient expressed understanding of the risks and benefits and agreed with plans for surgery.  Surgical site signed as per "right site surgery" protocol.  Electronic Signatures: Donato HeinzHooten, James P (MD)  (Signed 13-Aug-15 07:21)  Authored: Brief Consult Note   Last Updated: 13-Aug-15 07:21 by Donato HeinzHooten, James P (MD)

## 2016-08-28 DEATH — deceased
